# Patient Record
Sex: Male | Born: 1975 | Race: White | Hispanic: No | Marital: Married | Smoking: Never smoker
Health system: Southern US, Community
[De-identification: ages and names within clinical notes are randomized; demographics above are authoritative.]

## PROBLEM LIST (undated history)

## (undated) DIAGNOSIS — E7800 Pure hypercholesterolemia, unspecified: Secondary | ICD-10-CM

## (undated) DIAGNOSIS — N200 Calculus of kidney: Secondary | ICD-10-CM

## (undated) HISTORY — DX: Pure hypercholesterolemia, unspecified: E78.00

## (undated) HISTORY — DX: Calculus of kidney: N20.0

---

## 2007-07-20 ENCOUNTER — Emergency Department (HOSPITAL_COMMUNITY): Admission: EM | Admit: 2007-07-20 | Discharge: 2007-07-20 | Payer: Self-pay | Admitting: Emergency Medicine

## 2008-03-15 ENCOUNTER — Emergency Department (HOSPITAL_COMMUNITY): Admission: EM | Admit: 2008-03-15 | Discharge: 2008-03-15 | Payer: Self-pay | Admitting: Emergency Medicine

## 2009-11-25 IMAGING — CT CT ABDOMEN W/O CM
2 of 4 series · 17 of 46 positions shown, 19 images · non-contrast
Comparison: None

CT ABDOMEN

CLINICAL DATA: *Pain;; RIGHT-SIDED PAIN.  QUESTION STONE.

CT ABDOMEN AND PELVIS WITHOUT CONTRAST (CT UROGRAM)
TECHNIQUE: Multidetector CT imaging of the abdomen was performed
following the standard protocol without IV contrast.,Technique:
Multidetector CT imaging of the pelvis was performed following the
standard protocol without intravenous contrast.

[Series 2: >200 lbs-stone 5.0 b31f · axial · 0.86mm/px · z∈[+964,+1424]mm · 14 of 101 slices shown, 16 images]
[im 5/101  soft-tissue]
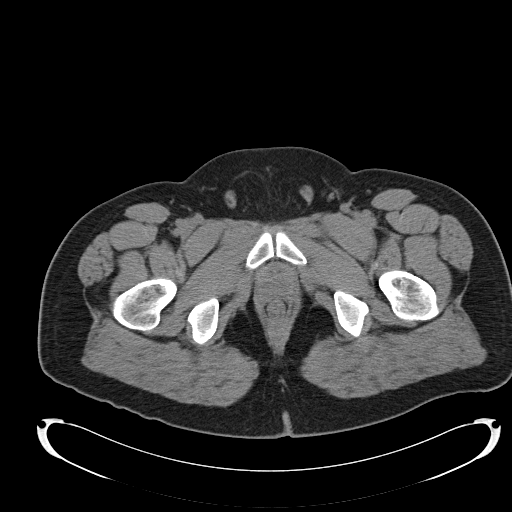
[im 5/101  bone]
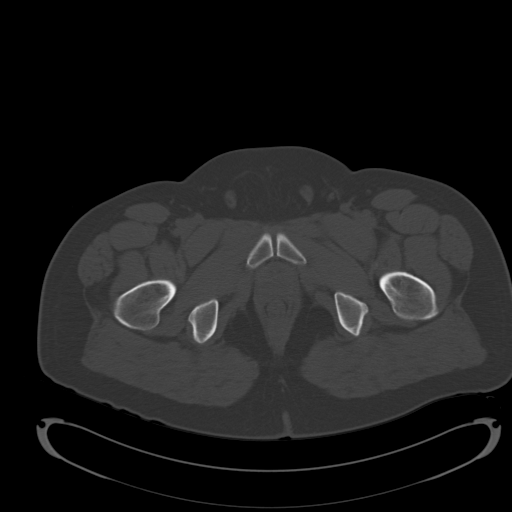
[im 13/101  soft-tissue]
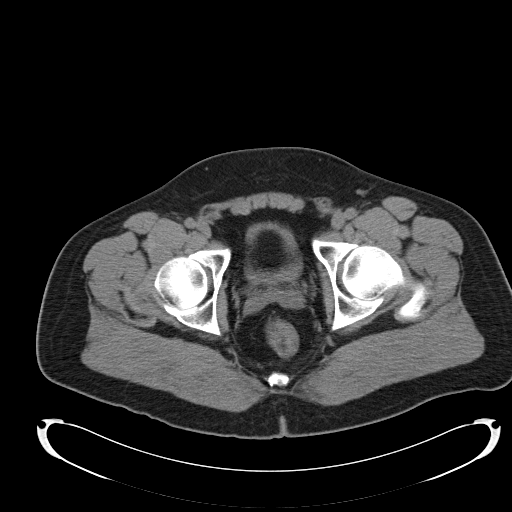
[im 21/101  soft-tissue]
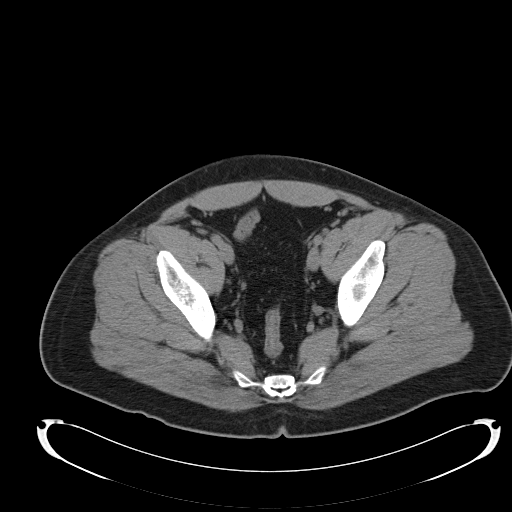
[im 29/101  soft-tissue]
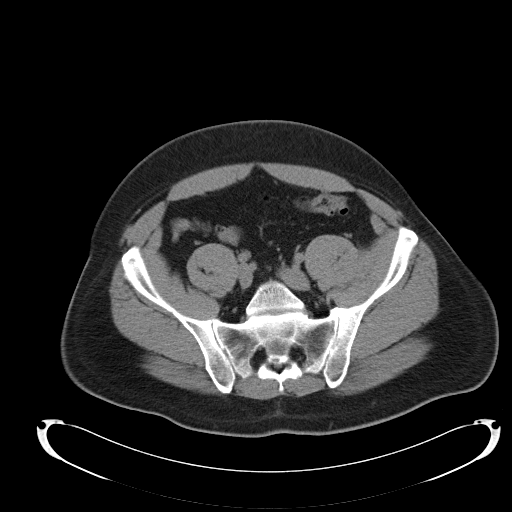
[im 33/101  soft-tissue]
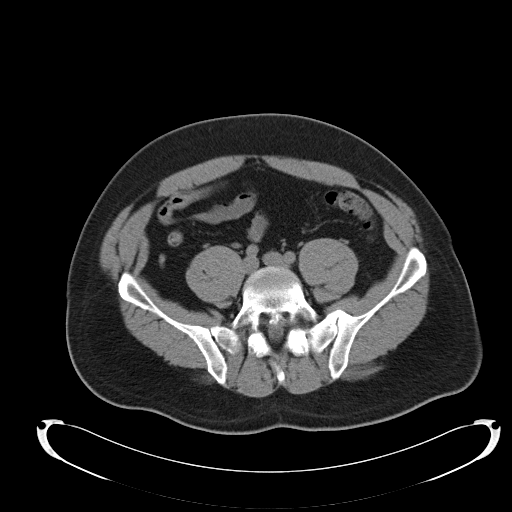
[im 41/101  soft-tissue]
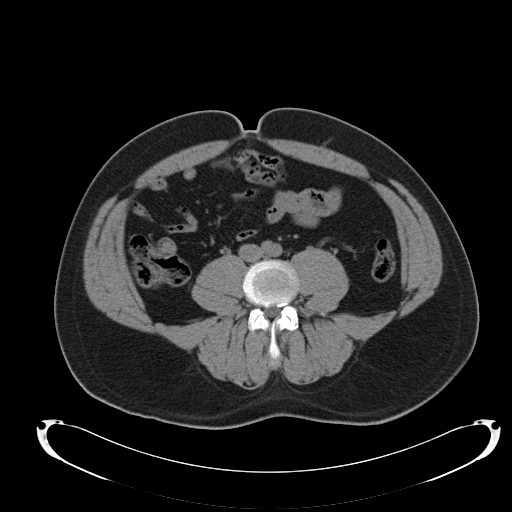
[im 49/101  soft-tissue]
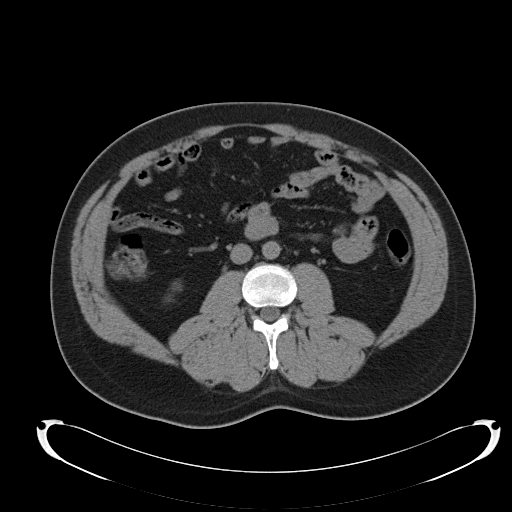
[im 53/101  soft-tissue]
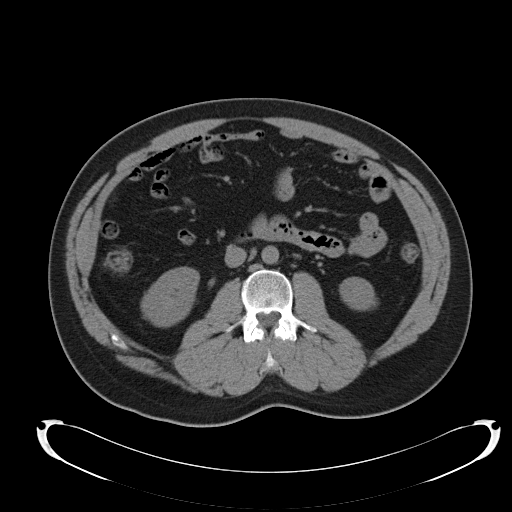
[im 61/101  soft-tissue]
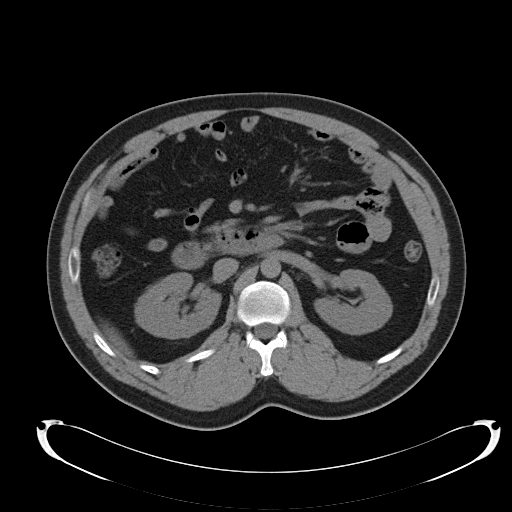
[im 61/101  bone]
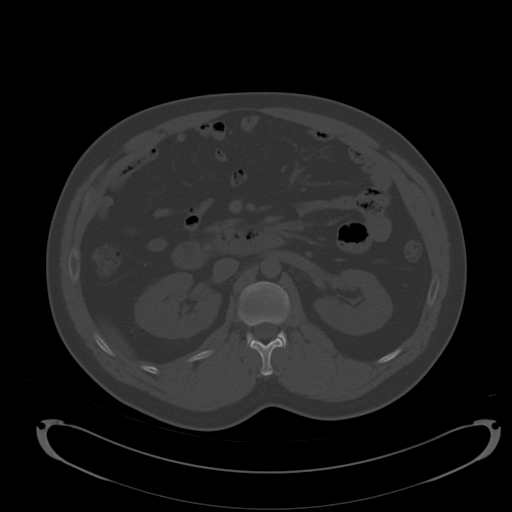
[im 69/101  soft-tissue]
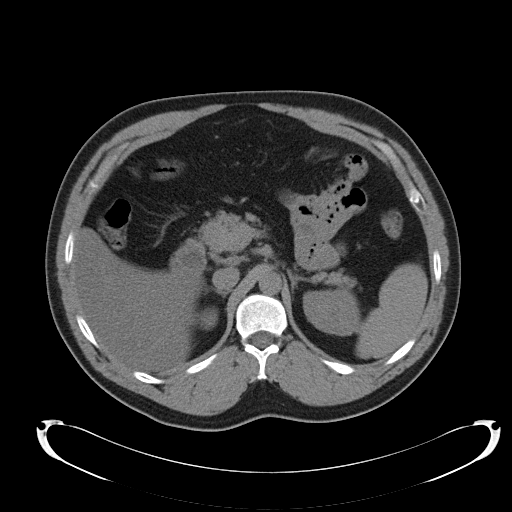
[im 77/101  soft-tissue]
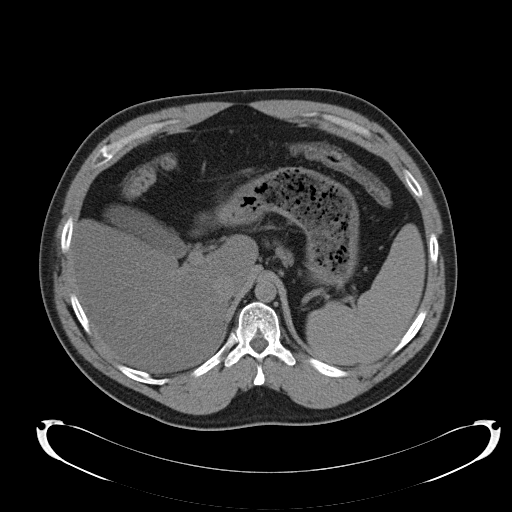
[im 81/101  soft-tissue]
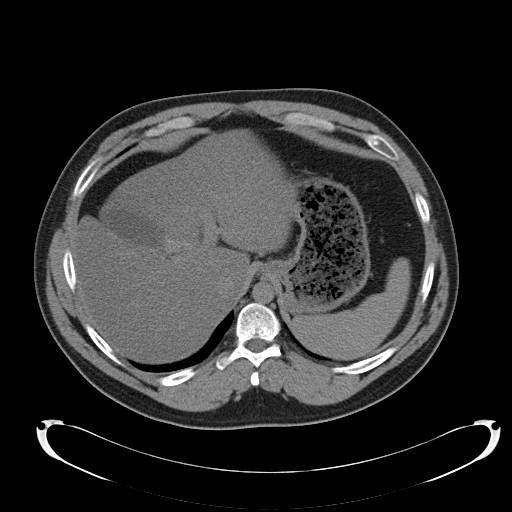
[im 89/101  soft-tissue]
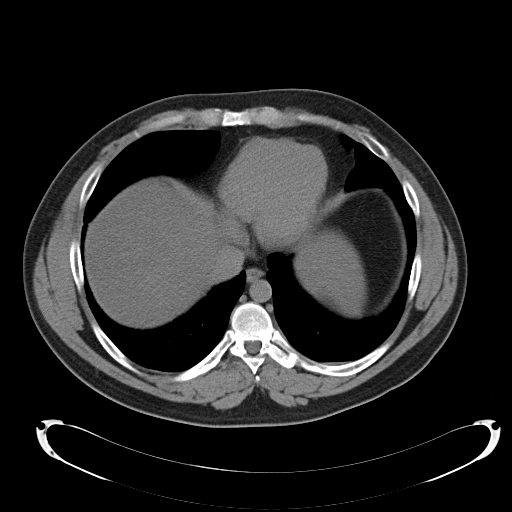
[im 97/101  soft-tissue]
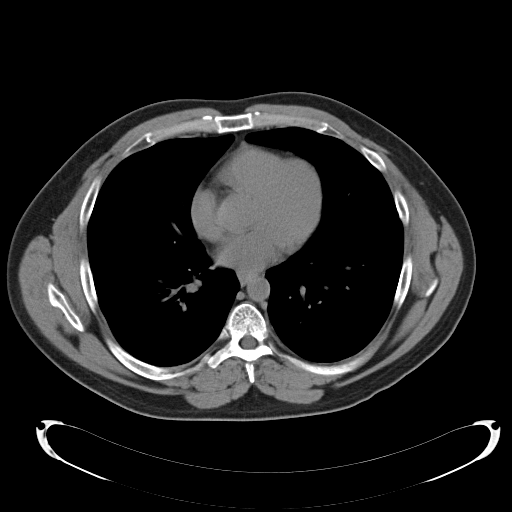

[Series 5: >200 lbs-stone 2.0 spo cor · coronal · 0.98mm/px · 3 of 138 slices shown]
[im 46/138  soft-tissue]
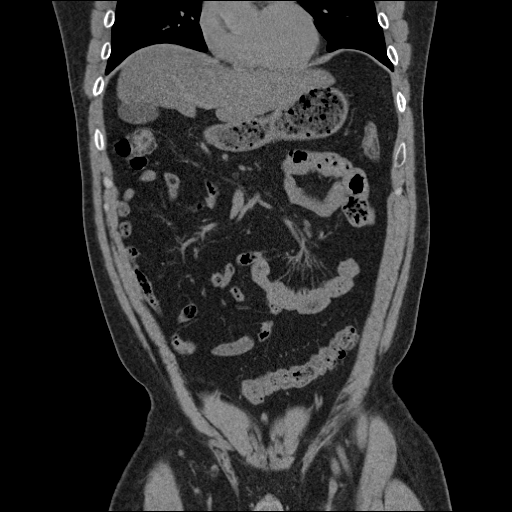
[im 61/138  soft-tissue]
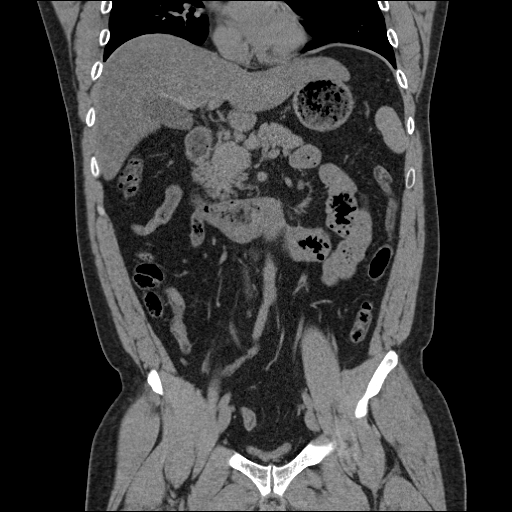
[im 77/138  soft-tissue]
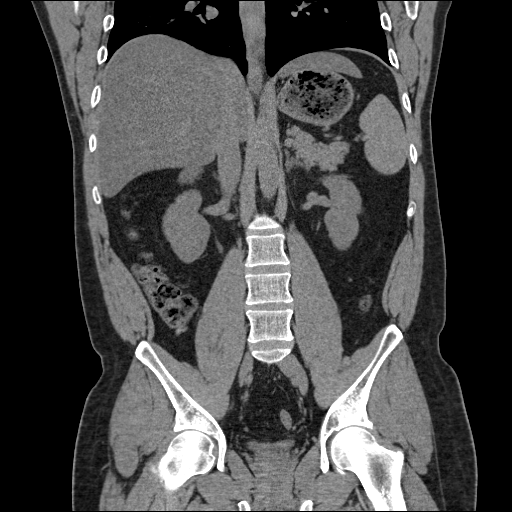

[17 of 46 positions shown; findings below may reference images not displayed]

FINDINGS: Exam is limited for evaluation of entities other than
urinary tract calculi due to lack of oral or intravenous contrast.

 Minimal right base scar or atelectasis.  Mild motion artifact
involves the lung bases.  Normal heart size without pericardial or
pleural effusion.

Moderate to marked fatty infiltration of the liver.  Normal spleen,
stomach, pancreas, gallbladder, biliary tract, adrenal glands.

Interpolar left renal calculus measures 6 mm.  No hydronephrosis or
right-sided renal calculi., no significant right-sided secondary
signs.

No retroperitoneal retrocrural adenopathy.  Transverse colon is
under distended.  Normal terminal ileum and appendix.

Normal abdominal small bowel loops without ascites.
IMPRESSION: 1.  Distal right-sided urinary tract calculus will be described
below.
2.  Nonobstructive left renal calculus.
3.  Fatty infiltration of the liver.

CT PELVIS
FINDINGS: The right ureter is minimally prominent to the level of
one - 2 mm right distal ureteric calculus approximately 5 mm above
the ureterovesicular junction.  Image 88.  Normal pelvic bowel
loops.  No pelvic adenopathy or ascites.  Normal urinary bladder
prostate.

Age advanced lumbar spondylosis.  A right paracentral disc bulge at
L1-L2.  Bilateral pars defects at L5.
IMPRESSION: 1.  One - 2 mm distal right ureteric calculus.

3.  Age advanced lumbar spondylosis with bilateral pars defects at

## 2011-09-04 LAB — DIFFERENTIAL
Basophils Absolute: 0.1
Lymphocytes Relative: 14
Neutro Abs: 8.2 — ABNORMAL HIGH

## 2011-09-04 LAB — URINALYSIS, ROUTINE W REFLEX MICROSCOPIC
Nitrite: NEGATIVE
Specific Gravity, Urine: 1.031 — ABNORMAL HIGH
pH: 7

## 2011-09-04 LAB — CBC
Platelets: 156
RDW: 12.4

## 2011-09-04 LAB — POCT I-STAT, CHEM 8
BUN: 18
Chloride: 105
Sodium: 140
TCO2: 27

## 2011-09-04 LAB — URINE MICROSCOPIC-ADD ON

## 2011-09-24 LAB — POCT URINE HEMOGLOBIN: Hgb urine dipstick: NEGATIVE

## 2015-11-10 ENCOUNTER — Telehealth: Payer: Self-pay | Admitting: Hematology and Oncology

## 2015-11-10 NOTE — Telephone Encounter (Signed)
New patient appt-s/w patient  and gave np appt for 01/10 @ 1 w/Dr. Pamelia HoitGudena Referring Lindaann PascalScott Long, PA Dx- Lymphadenopathy  Referral information scanned.   FYI-patient requested an appt after the new year.

## 2015-12-19 DIAGNOSIS — R591 Generalized enlarged lymph nodes: Secondary | ICD-10-CM | POA: Insufficient documentation

## 2015-12-20 ENCOUNTER — Other Ambulatory Visit: Payer: Self-pay

## 2015-12-20 ENCOUNTER — Ambulatory Visit: Payer: Self-pay | Admitting: Hematology and Oncology

## 2015-12-20 NOTE — Assessment & Plan Note (Deleted)
Axillary lymphadenopathy:   Patient reports that these nodes had been enlarged at least since 1 year.  He does not report any fevers all illnesses. He denies any fevers chills night sweats or weight loss. He denies any pain in these lymph nodes.    Recommendations: 1.  CT chest abdomen pelvis 2.  Biopsy of one of these lymph nodes for testing 3.  I reviewed his blood work and he does not have any abnormalities of CBC or CMP.   Return to clinic after these procedures have been performed.

## 2015-12-21 ENCOUNTER — Telehealth: Payer: Self-pay | Admitting: Hematology and Oncology

## 2015-12-21 NOTE — Telephone Encounter (Signed)
Message to HIM re new pt appointment - r/s per 1/10 pof

## 2015-12-22 NOTE — Telephone Encounter (Signed)
Left message for patient to return call to r/s appt  °

## 2022-07-12 ENCOUNTER — Encounter: Payer: Self-pay | Admitting: Emergency Medicine

## 2022-07-12 ENCOUNTER — Ambulatory Visit
Admission: EM | Admit: 2022-07-12 | Discharge: 2022-07-12 | Disposition: A | Discharge status: Home / Self Care | Payer: BC Managed Care – PPO | Attending: Family Medicine | Admitting: Family Medicine

## 2022-07-12 DIAGNOSIS — S20319A Abrasion of unspecified front wall of thorax, initial encounter: Secondary | ICD-10-CM

## 2022-07-12 DIAGNOSIS — M25512 Pain in left shoulder: Secondary | ICD-10-CM | POA: Diagnosis not present

## 2022-07-12 MED ORDER — NAPROXEN 500 MG PO TABS: 500.0000 mg | ORAL_TABLET | Freq: Two times a day (BID) | ORAL | 0 refills | Status: DC | PRN

## 2022-07-12 MED ORDER — CYCLOBENZAPRINE HCL 10 MG PO TABS: 10.0000 mg | ORAL_TABLET | Freq: Three times a day (TID) | ORAL | 0 refills | Status: DC | PRN
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
## 2023-03-30 ENCOUNTER — Encounter (HOSPITAL_COMMUNITY): Payer: Self-pay

## 2023-03-30 ENCOUNTER — Emergency Department (HOSPITAL_COMMUNITY): Payer: BC Managed Care – PPO

## 2023-03-30 ENCOUNTER — Other Ambulatory Visit: Payer: Self-pay

## 2023-03-30 ENCOUNTER — Emergency Department (HOSPITAL_COMMUNITY)
Admission: EM | Admit: 2023-03-30 | Discharge: 2023-03-30 | Disposition: A | Discharge status: Home / Self Care | Payer: BC Managed Care – PPO | Attending: Emergency Medicine | Admitting: Emergency Medicine

## 2023-03-30 DIAGNOSIS — R079 Chest pain, unspecified: Secondary | ICD-10-CM | POA: Insufficient documentation

## 2023-03-30 LAB — CBC
HCT: 45.1 % (ref 39.0–52.0)
Hemoglobin: 16.3 g/dL (ref 13.0–17.0)
MCH: 33.2 pg (ref 26.0–34.0)
MCHC: 36.1 g/dL — ABNORMAL HIGH (ref 30.0–36.0)
MCV: 91.9 fL (ref 80.0–100.0)
Platelets: 162 10*3/uL (ref 150–400)
RBC: 4.91 MIL/uL (ref 4.22–5.81)
RDW: 12.7 % (ref 11.5–15.5)
WBC: 9.2 10*3/uL (ref 4.0–10.5)
nRBC: 0 % (ref 0.0–0.2)

## 2023-03-30 LAB — BASIC METABOLIC PANEL
Anion gap: 9 (ref 5–15)
BUN: 14 mg/dL (ref 6–20)
CO2: 25 mmol/L (ref 22–32)
Calcium: 9.3 mg/dL (ref 8.9–10.3)
Chloride: 104 mmol/L (ref 98–111)
Creatinine, Ser: 0.92 mg/dL (ref 0.61–1.24)
GFR, Estimated: >60 mL/min (ref >60)
Glucose, Bld: 93 mg/dL (ref 70–99)
Potassium: 3.9 mmol/L (ref 3.5–5.1)
Sodium: 138 mmol/L (ref 135–145)

## 2023-03-30 LAB — TROPONIN I (HIGH SENSITIVITY)
Troponin I (High Sensitivity): 2 ng/L (ref <18)
Troponin I (High Sensitivity): 2 ng/L (ref <18)

## 2023-03-30 MED ORDER — IBUPROFEN 600 MG PO TABS: 600.0000 mg | ORAL_TABLET | Freq: Four times a day (QID) | ORAL | 0 refills | Status: DC | PRN

## 2023-03-30 MED ORDER — KETOROLAC TROMETHAMINE 15 MG/ML IJ SOLN: 15.0000 mg | Freq: Once | INTRAMUSCULAR | Status: DC

## 2023-03-30 MED ORDER — LIDOCAINE 5 % EX PTCH: 1.0000 | MEDICATED_PATCH | CUTANEOUS | 0 refills | Status: AC

## 2023-03-30 MED ORDER — ACETAMINOPHEN 325 MG PO TABS
650.0000 mg | ORAL_TABLET | Freq: Once | ORAL | Status: AC
  Administered 2023-03-30: 650 mg via ORAL
  Filled 2023-03-30: qty 2

## 2023-03-30 MED ORDER — KETOROLAC TROMETHAMINE 15 MG/ML IJ SOLN
15.0000 mg | Freq: Once | INTRAMUSCULAR | Status: AC
  Administered 2023-03-30: 15 mg via INTRAVENOUS
  Filled 2023-03-30: qty 1

## 2023-03-30 NOTE — Discharge Instructions (Signed)
Today for chest pain your workup was very reassuring, your chest x-ray was normal, troponin tests were normal, your EKG did not show any signs of ischemia.  Heart rate is somewhat low but with normal blood pressure this is not a concern.  If you have worsening pain, shortness of breath, dizziness or other worsening symptoms please come back to the ER right away.  Otherwise follow-up closely with your primary care doctor and cardiology.  [facility] Primary Care Doctor List    Syliva Overman, MD. Specialty: [facility] Medicine Contact information: [address]  [place] [address]  [phone]   Lilyan Punt, MD. Specialty: [facility] Medicine Contact information: [address]  [place] [address]  [phone]   Avon Gully, MD Specialty: Internal Medicine Contact information: [address] [place] [address]  [phone]   Catalina Pizza, MD. Specialty: Internal Medicine Contact information: [address]  [place] [address]  [phone]    [facility] (Dr. Selena Batten) Specialty: Family Medicine Contact information: [address]  [place] [address]  [phone]   John Giovanni, MD. Specialty: [facility] Medicine Contact information: [address]  [address]  [place] [address]  [phone]   Carylon Perches, MD. Specialty: Internal Medicine Contact information: [address]  [address]  [place] [address]  [phone]    [facility]  [address] [place], [place] [address] [phone]  Services [facility] offers a variety of basic health services.  Services include but are not limited to: Blood pressure checks  Heart rate checks  Blood sugar checks  Urine analysis  Rapid strep tests  Pregnancy tests.  Health education and referrals  People needing more complex services will be directed to a physician online. Using these virtual  visits, doctors can evaluate and prescribe medicine and treatments. There will be no medication on-site, though [facility] will help patients fill their prescriptions at little to no cost.   For More information please go to: [email or web address]

## 2023-03-30 NOTE — ED Triage Notes (Signed)
Pt sent from UC due to right sided chest tightness and discomfort for the last 24 hours. Pain increased this morning. Denies injury, no radiation, no nausea/vomiting/sweating. Endorses SHOB.   Family history of MI (father passed away at 54 years)  No medications PTA.   Pt A & O x 4, ambulatory in triage.

## 2023-03-30 NOTE — ED Provider Notes (Signed)
EMERGENCY DEPARTMENT AT [facility] Provider Note   CSN: [id number] Arrival date & time: 03/30/23  1840     History  Chief Complaint  Patient presents with   Chest Pain    Donald Sosa is a 47 y.o. male.  Denies any chronic PMH.  Presents the ER complaining of right-sided chest pain that started last night.  He states he thought he had pulled a muscle, woke up and did not have any pain and then throughout the day has had some worsening right-sided chest pain.  He again states it feels like a sore pulled muscle but also somewhat feels like somebody "knocked the wind out of me" on that side.  Does not feel short of breath, pain is not worse with respirations, denies palpitations, dizziness, radiation of the pain, no nausea or diaphoresis.  Smokes marijuana daily, no other drug or alcohol use, no tobacco use.  Patient reports father had a heart attack when he was in his 47s.  Does not follow-up with primary care routinely  Has no leg pain or swelling, no history of VTE, no recent immobilization surgeries or traumas.   Chest Pain      Home Medications Prior to Admission medications   Medication Sig Start Date End Date Taking? Authorizing Provider  ibuprofen (ADVIL) 600 MG tablet Take 1 tablet (600 mg total) by mouth every 6 (six) hours as needed. 03/30/23  Yes Keishawna Carranza A, PA-C  lidocaine (LIDODERM) 5 % Place 1 patch onto the skin daily. Remove & Discard patch within 12 hours or as directed by MD 03/30/23  Yes Cristi Loron, Jeri Rawlins A, PA-C  atorvastatin (LIPITOR) 20 MG tablet Take 20 mg by mouth daily.    [provider]  cyclobenzaprine (FLEXERIL) 10 MG tablet Take 1 tablet (10 mg total) by mouth 3 (three) times daily as needed for muscle spasms. Do not drink alcohol or drive while taking this medication.  May cause drowsiness. 07/12/22   Particia Nearing, PA-C  meloxicam (MOBIC) 15 MG tablet Take 15 mg by mouth daily.    [provider]   sildenafil (VIAGRA) 50 MG tablet Take 50 mg by mouth daily as needed for erectile dysfunction.    [provider]  tiZANidine (ZANAFLEX) 4 MG capsule Take 4 mg by mouth 3 (three) times daily.    [provider]      Allergies    Patient has no known allergies.    Review of Systems   Review of Systems  Cardiovascular:  Positive for chest pain.    Physical Exam Updated Vital Signs BP 131/88   Pulse (!) 46   Temp 98.4 F (36.9 C) (Oral)   Resp (!) 23   Ht  (1.727 m)   Wt 90.3 kg   SpO2 97%   BMI 30.26 kg/m  Physical Exam Vitals and nursing note reviewed.  Constitutional:      General: He is not in acute distress.    Appearance: He is well-developed.  HENT:     Head: Normocephalic and atraumatic.  Eyes:     Conjunctiva/sclera: Conjunctivae normal.  Cardiovascular:     Rate and Rhythm: Normal rate and regular rhythm.     Heart sounds: Normal heart sounds. No murmur heard. Pulmonary:     Effort: Pulmonary effort is normal. No tachypnea or respiratory distress.     Breath sounds: Normal breath sounds.  Chest:     Chest wall: Crepitus present. No tenderness.  Abdominal:  Palpations: Abdomen is soft.     Tenderness: There is no abdominal tenderness.  Musculoskeletal:        General: No swelling. Normal range of motion.     Cervical back: Neck supple.     Right lower leg: No tenderness. No edema.     Left lower leg: No tenderness. No edema.  Skin:    General: Skin is warm and dry.     Capillary Refill: Capillary refill takes less than 2 seconds.  Neurological:     Mental Status: He is alert.  Psychiatric:        Mood and Affect: Mood normal.     ED Results / Procedures / Treatments   Labs (all labs ordered are listed, but only abnormal results are displayed) Labs Reviewed  CBC - Abnormal; Notable for the following components:      Result Value   MCHC 36.1 (*)    All other components within normal limits  BASIC METABOLIC PANEL   TROPONIN I (HIGH SENSITIVITY)  TROPONIN I (HIGH SENSITIVITY)    EKG EKG Interpretation  Date/Time:  Saturday March 30 2023 18:53:30 EDT Ventricular Rate:  53 PR Interval:  140 QRS Duration: 110 QT Interval:  426 QTC Calculation: 399 R Axis:   9 Text Interpretation: Sinus bradycardia Otherwise normal ECG No previous ECGs available No acute changes No old tracing to compare [id number] Confirmed by Derwood Kaplan [phone]) on 03/30/2023 9:56:38 PM  Radiology DG Chest 2 View  Result Date: 03/30/2023 CLINICAL DATA:  Chest pain EXAM: CHEST - 2 VIEW COMPARISON:  None Available. FINDINGS: Low lung volumes. The heart size and mediastinal contours are within normal limits. Both lungs are clear. The visualized skeletal structures are unremarkable. Likely small left renal stone. IMPRESSION: 1. No active cardiopulmonary disease. 2. Likely small left renal stone. Electronically Signed   By: Lorenza Cambridge M.D.   On: 03/30/2023 19:26    Procedures Procedures    Medications Ordered in ED Medications  ketorolac (TORADOL) 15 MG/ML injection 15 mg (15 mg Intravenous Given 03/30/23 2047)  acetaminophen (TYLENOL) tablet 650 mg (650 mg Oral Given 03/30/23 2131)    ED Course/ Medical Decision Making/ A&P             HEART Score: 2                Medical Decision Making The patient presented today for chest pain that had started last night, resolved in the started again this afternoon and has been constant. EKG showed bradycardia, no ischemic changes, Chest Xray was independently reviewed by me and shows pulmonary but, no infiltrate, no pneumothorax. I agree with radiology interpretation.   They were given toradol and tylenol for their symptoms, and on repeat evaluation their symptoms are improved  I considered a broad differential including but not limited to ACS, PE, Dissection, pneumothorax, costochondritis, pneumonia, GERD, pericarditis, and pericardial effusion.  PE is considered low risk and ruled  out by [facility] criteria (all 8 criteria negative)  I feel disssection is extremely unlikely  Symptoms and EKG are not consisted with pericardial effusion  Their heart score is 2. Troponins negative  Plan is for home with outpatient PCP follow-up, given his concern for his cardiac family history given cardiology information as well.   Amount and/or Complexity of Data Reviewed Labs: ordered. Radiology: ordered.  Risk OTC drugs. Prescription drug management.           Final Clinical Impression(s) / ED Diagnoses Final diagnoses:  Chest  pain, unspecified type    Rx / DC Orders ED Discharge Orders          Ordered    lidocaine (LIDODERM) 5 %  Every 24 hours        03/30/23 2159    ibuprofen (ADVIL) 600 MG tablet  Every 6 hours PRN        03/30/23 2203              Josem Kaufmann 03/30/23 2204    Derwood Kaplan, MD 04/04/23 [phone]

## 2023-03-30 NOTE — ED Notes (Signed)
Introduced myself to patient. Patient reports pain 4/10 on the right side of chest. Patient states chest pain started yesterday. Denies nausea or vomiting, shortness of breath. Family at bedside.

## 2024-12-31 ENCOUNTER — Ambulatory Visit
Admission: EM | Admit: 2024-12-31 | Discharge: 2024-12-31 | Disposition: A | Discharge status: Home / Self Care | Attending: Family Medicine | Admitting: Family Medicine

## 2024-12-31 DIAGNOSIS — R10A1 Flank pain, right side: Secondary | ICD-10-CM

## 2024-12-31 DIAGNOSIS — R55 Syncope and collapse: Secondary | ICD-10-CM

## 2024-12-31 DIAGNOSIS — M545 Low back pain, unspecified: Secondary | ICD-10-CM

## 2024-12-31 DIAGNOSIS — Z87442 Personal history of urinary calculi: Secondary | ICD-10-CM | POA: Diagnosis not present

## 2024-12-31 LAB — POCT URINE DIPSTICK
Bilirubin, UA: NEGATIVE
Glucose, UA: NEGATIVE mg/dL
Ketones, POC UA: NEGATIVE mg/dL
Leukocytes, UA: NEGATIVE
Nitrite, UA: NEGATIVE
Protein Ur, POC: NEGATIVE mg/dL
Spec Grav, UA: 1.02
Urobilinogen, UA: 1 U/dL
pH, UA: 6

## 2024-12-31 MED ORDER — TAMSULOSIN HCL 0.4 MG PO CAPS: 0.4000 mg | ORAL_CAPSULE | Freq: Every day | ORAL | 0 refills | Status: AC

## 2024-12-31 MED ORDER — CYCLOBENZAPRINE HCL 5 MG PO TABS: 5.0000 mg | ORAL_TABLET | Freq: Every evening | ORAL | 0 refills | Status: AC | PRN

## 2024-12-31 MED ORDER — IBUPROFEN 800 MG PO TABS: 800.0000 mg | ORAL_TABLET | Freq: Three times a day (TID) | ORAL | 0 refills | Status: AC

## 2024-12-31 NOTE — ED Triage Notes (Signed)
 Pt states that he has  lower back pain. Pt states that he has a history of kidney stones. X1 week   Pt denies taking any otc medications.

## 2024-12-31 NOTE — ED Provider Notes (Signed)
 " [facility]  Note:  This document was prepared using Conservation officer, historic buildings and may include unintentional dictation errors.  MRN: [id number] DOB: [birth year]  Subjective:   ANIEL HUBBLE is a 49 y.o. male presenting for 1 week history of persistent low back pain, right-sided flank pain.  Patient does report that 3 days ago he had a syncope and collapse after he got up to go to the restroom.  Fall was witnessed by his wife who called EMS and was evaluated, stabilized.  Patient reports a history of these episodes.  Has had extensive workup in 2024 and 2025.  Reports that all of his testing was negative including with a cardiologist and other specialist.  Due to the cost burden of all his referrals, patient stopped following up especially since all his tests were normal.  Today, his primary concern is having a kidney stone.  Has a history of this.  Has not seen frank hematuria.  He is trying to hydrate better.  Current Outpatient Medications  Medication Instructions   atorvastatin (LIPITOR) 20 mg, Oral, Daily   cyclobenzaprine  (FLEXERIL ) 10 mg, Oral, 3 times daily PRN, Do not drink alcohol or drive while taking this medication.  May cause drowsiness.   ibuprofen  (ADVIL ) 600 mg, Oral, Every 6 hours PRN   lidocaine  (LIDODERM ) 5 % 1 patch, Transdermal, Every 24 hours, Remove & Discard patch within 12 hours or as directed by MD   meloxicam (MOBIC) 15 mg, Oral, Daily   metoprolol tartrate (LOPRESSOR) 12.5 mg   rosuvastatin (CRESTOR) 10 mg   sildenafil (VIAGRA) 50 mg, Oral, Daily PRN   tiZANidine (ZANAFLEX) 4 mg, Oral, 3 times daily    Allergies[1]  Past Medical History:  Diagnosis Date   Hypercholesteremia    Kidney calculus      History reviewed. No pertinent surgical history.  Family History  Problem Relation Age of Onset   Colon cancer Paternal Grandfather     Social History   Occupational History   Not on file  Tobacco Use   Smoking  status: Never   Smokeless tobacco: Not on file  Vaping Use   Vaping status: Never Used  Substance and Sexual Activity   Alcohol use: Yes    Comment: socially   Drug use: Never   Sexual activity: Not on file     ROS   Objective:   Vitals: BP 134/88 (BP Location: Left Arm)   Pulse 79   Temp 97.6 F (36.4 C) (Oral)   Resp 17   Ht 5' 7 (1.702 m)   Wt 195 lb (88.5 kg)   SpO2 96%   BMI 30.54 kg/m   Physical Exam Constitutional:      General: He is not in acute distress.    Appearance: Normal appearance. He is well-developed and normal weight. He is not ill-appearing, toxic-appearing or diaphoretic.  HENT:     Head: Normocephalic and atraumatic.     Right Ear: External ear normal.     Left Ear: External ear normal.     Nose: Nose normal.     Mouth/Throat:     Mouth: Mucous membranes are moist.  Eyes:     General: No scleral icterus.       Right eye: No discharge.        Left eye: No discharge.     Extraocular Movements: Extraocular movements intact.  Cardiovascular:     Rate and Rhythm: Normal rate and regular rhythm.  Heart sounds: Normal heart sounds. No murmur heard.    No friction rub. No gallop.  Pulmonary:     Effort: Pulmonary effort is normal. No respiratory distress.     Breath sounds: Normal breath sounds. No stridor. No wheezing, rhonchi or rales.  Abdominal:     General: Bowel sounds are normal. There is no distension.     Palpations: Abdomen is soft. There is no mass.     Tenderness: There is no abdominal tenderness. There is no right CVA tenderness, left CVA tenderness, guarding or rebound.  Musculoskeletal:     Cervical back: Normal range of motion.     Lumbar back: Spasms and tenderness (by report only) present. No swelling, edema, deformity, signs of trauma, lacerations or bony tenderness. Normal range of motion. Negative right straight leg raise test and negative left straight leg raise test. No scoliosis.       Back:  Skin:    General: Skin  is warm and dry.  Neurological:     Mental Status: He is alert and oriented to person, place, and time.     Cranial Nerves: No cranial nerve deficit.     Motor: No weakness.     Coordination: Coordination normal.     Gait: Gait normal.     Comments: No facial asymmetry.  Psychiatric:        Mood and Affect: Mood normal.        Behavior: Behavior normal.        Thought Content: Thought content normal.        Judgment: Judgment normal.    Results for orders placed or performed during the hospital encounter of 12/31/24 (from the past 24 hours)  POCT URINE DIPSTICK     Status: Abnormal   Collection Time: 12/31/24 12:54 PM  Result Value Ref Range   Color, UA yellow yellow   Clarity, UA clear clear   Glucose, UA negative negative mg/dL   Bilirubin, UA negative negative   Ketones, POC UA negative negative mg/dL   Spec Grav, UA 8.979 8.989 - 1.025   Blood, UA trace-intact (A) negative   pH, UA 6.0 5.0 - 8.0   Protein Ur, POC negative negative mg/dL   Urobilinogen, UA 1.0 0.2 or 1.0 E.U./dL   Nitrite, UA Negative Negative   Leukocytes, UA Negative Negative   Assessment and Plan :   PDMP not reviewed this encounter.  1. Right flank pain   2. Acute low back pain without sciatica, unspecified back pain laterality   3. Syncope and collapse   4. History of renal calculi      Had a discussion with patient about syncope and collapse. Given his extensive work up that was negative patient and I agree to defer ER visit. Has a reassuring  neurologic and cardiopulmonary exam.  Recommend start tamsulosin , pushing fluids.  For musculoskeletal back pain, can use ibuprofen  and cyclobenzaprine .  Maintain strict ER precautions.  Counseled patient on potential for adverse effects with medications prescribed/recommended today, ER and return-to-clinic precautions discussed, patient verbalized understanding.      [1] No Known Allergies    Christopher Savannah, PA-C 12/31/24 1401  "
